# Patient Record
Sex: Female | Born: 1995 | Race: White | Hispanic: No | Marital: Single | Smoking: Never smoker
Health system: Southern US, Community
[De-identification: ages and names within clinical notes are randomized; demographics above are authoritative.]

## PROBLEM LIST (undated history)

## (undated) DIAGNOSIS — J45909 Unspecified asthma, uncomplicated: Secondary | ICD-10-CM

## (undated) HISTORY — PX: WISDOM TOOTH EXTRACTION: SHX21

## (undated) HISTORY — DX: Unspecified asthma, uncomplicated: J45.909

---
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
## 2015-09-16 IMAGING — CR DG FOOT COMPLETE 3+V*R*
3 series · 3 of 3 positions shown · non-contrast
Comparison: None.

CLINICAL DATA: Right medial tarsal pain

EXAM:
RIGHT FOOT COMPLETE - 3+ VIEW

[view not recorded (1 of 3)]
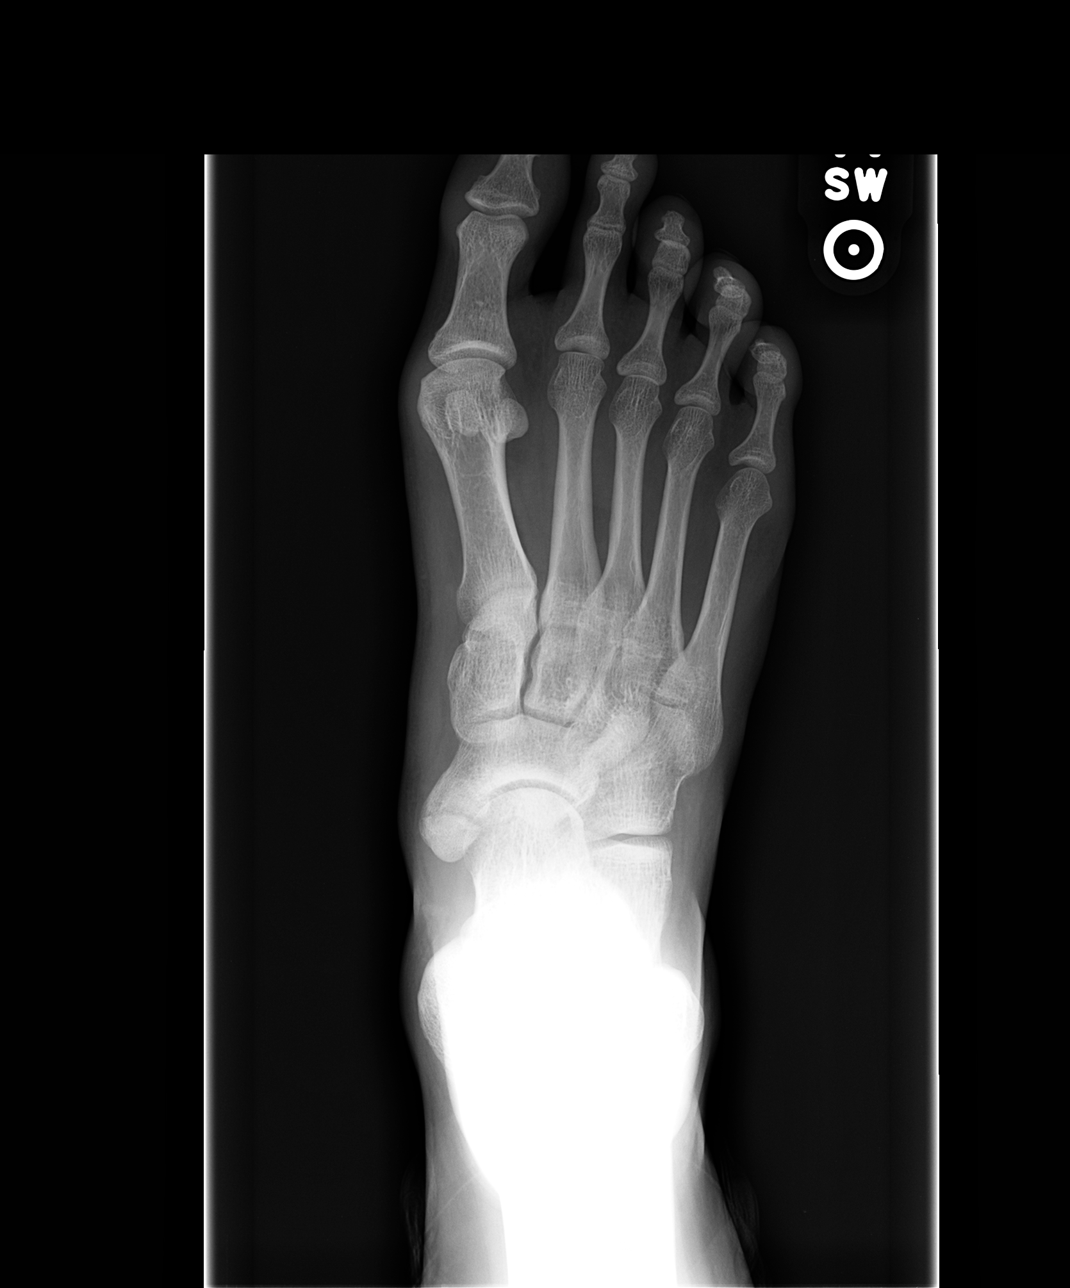

[view not recorded (2 of 3)]
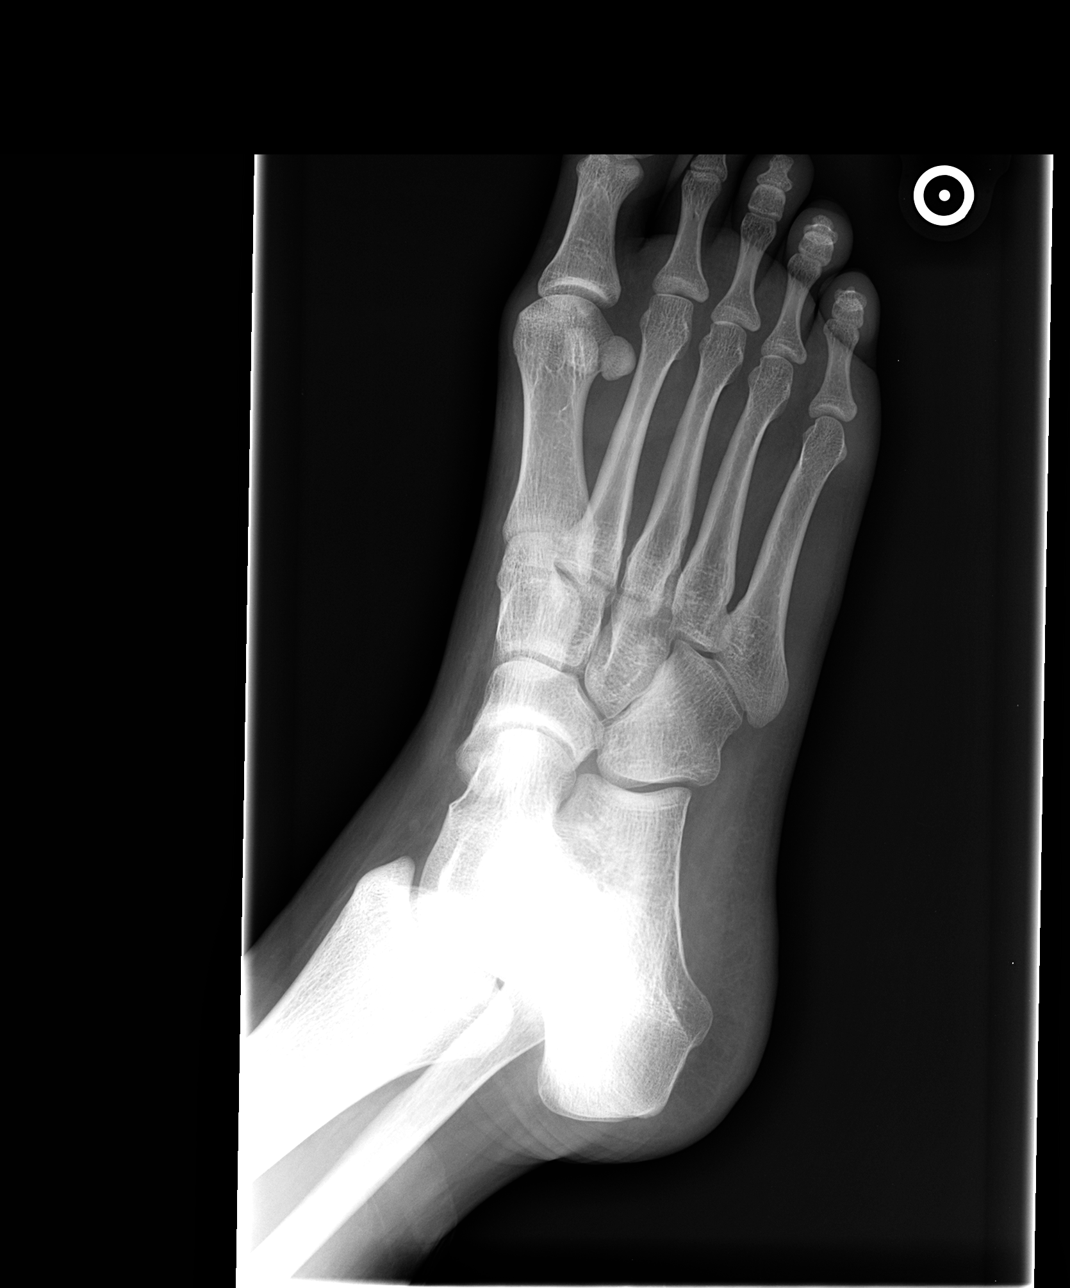

[view not recorded (3 of 3)]
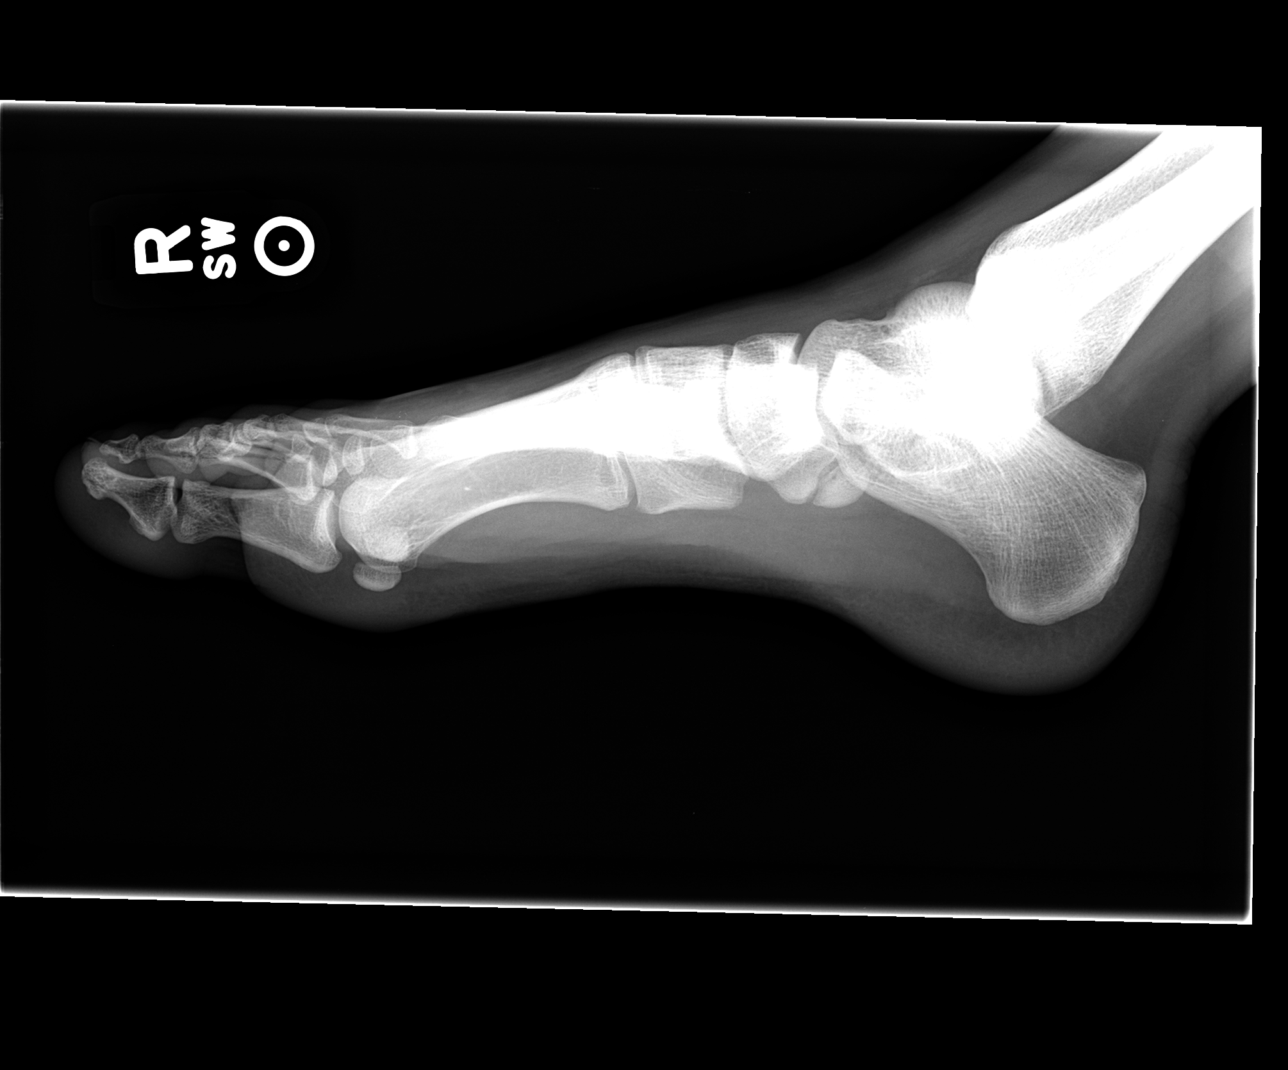

[3 of 3 positions shown; findings below may reference images not displayed]

FINDINGS: Three views of the right foot submitted. No displaced fracture or
subluxation. There is probable partial fused accessory os navicular.
As there is irregular lucent line at this level further correlation
with MRI is recommended to exclude osteochondral stress reaction.
IMPRESSION: No displaced fracture or subluxation. There is probable partial
fused accessory os navicular. As there is irregular lucent line at
this level further correlation with MRI is recommended to exclude
osteochondral stress reaction.

## 2016-05-01 ENCOUNTER — Ambulatory Visit (INDEPENDENT_AMBULATORY_CARE_PROVIDER_SITE_OTHER): Payer: BLUE CROSS/BLUE SHIELD | Admitting: Sports Medicine

## 2016-05-01 ENCOUNTER — Encounter: Payer: Self-pay | Admitting: Sports Medicine

## 2016-05-01 VITALS — BP 110/73 | HR 66 | Resp 16 | Wt 152.5 lb

## 2016-05-01 DIAGNOSIS — M84374D Stress fracture, right foot, subsequent encounter for fracture with routine healing: Secondary | ICD-10-CM

## 2016-05-01 NOTE — Assessment & Plan Note (Signed)
Minimal increase in pain at the navicular prominence of reproduction of pain with firing of the tibialis posterior. New set of custom orthotics as above, former base, and more attention paid to the arch. Return as needed. 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

## 2016-05-01 NOTE — Progress Notes (Signed)

## 2017-02-19 ENCOUNTER — Ambulatory Visit (INDEPENDENT_AMBULATORY_CARE_PROVIDER_SITE_OTHER): Payer: BLUE CROSS/BLUE SHIELD | Admitting: Physician Assistant

## 2017-02-19 ENCOUNTER — Encounter: Payer: Self-pay | Admitting: Physician Assistant

## 2017-02-19 VITALS — BP 113/69 | HR 90 | Ht 69.0 in | Wt 153.0 lb

## 2017-02-19 DIAGNOSIS — Z3009 Encounter for other general counseling and advice on contraception: Secondary | ICD-10-CM

## 2017-02-19 DIAGNOSIS — Z0000 Encounter for general adult medical examination without abnormal findings: Secondary | ICD-10-CM

## 2017-02-19 DIAGNOSIS — R5383 Other fatigue: Secondary | ICD-10-CM | POA: Diagnosis not present

## 2017-02-19 DIAGNOSIS — K130 Diseases of lips: Secondary | ICD-10-CM

## 2017-02-19 DIAGNOSIS — L659 Nonscarring hair loss, unspecified: Secondary | ICD-10-CM

## 2017-02-19 MED ORDER — LEVONORGESTREL 1.5 MG PO TABS: 1.5000 mg | ORAL_TABLET | Freq: Once | ORAL | 0 refills | Status: AC

## 2017-02-19 MED ORDER — TRIAMCINOLONE ACETONIDE 0.1 % EX CREA: 1.0000 "application " | TOPICAL_CREAM | Freq: Two times a day (BID) | CUTANEOUS | 0 refills | Status: DC

## 2017-02-19 NOTE — Progress Notes (Signed)
HPI:                                                                Katherine Salinas is a 21 y.o. female who presents to [facility]: Primary Care Sports Medicine today to establish care   Current Concerns include thyroid, hair loss, weight  Patient states over the last few months she has noticed increased shedding of her hair. She states that her hair stylist and mother also noticed. She is able to remove hair with gentle finger brushing. She self-discontinued her OCP's and changed her hair products to see if this would help, but has not noticed a change. She does endorse fatigue and increased stress at college, but denies depressed mood or insomnia. She also states her weight has fluctuated unintentionally up and down by approximately 10 pounds during this time. Denies fevers, chills, myalgias, joint pain, or rash. She reports a family history of thyroid disorders.  Health Maintenance Health Maintenance  Topic Date Due  . CHLAMYDIA SCREENING  04/30/2011  . HIV Screening  04/30/2011  . INFLUENZA VACCINE  08/18/2017 (Originally 07/18/2016)  . TETANUS/TDAP  05/22/2026    GYN/Sexual Health  Menstrual status: having periods  LMP: 02/18/17  Menses:   Last pap smear: n/a will start age 60  History of abnormal pap smears: n/a  Sexually active: yes  Current contraception: condoms  Health Habits  Diet: good, only eats chicken  Exercise: dance team  ETOH: none  Tobacco: none  Drugs: none  Past Medical History:  Diagnosis Date  . Asthma    Past Surgical History:  Procedure Laterality Date  . WISDOM TOOTH EXTRACTION     Social History  Substance Use Topics  . Smoking status: Never Smoker  . Smokeless tobacco: Never Used  . Alcohol use No   family history includes Pancreatic cancer in her maternal grandmother.  ROS: negative except as noted in the HPI  Medications: Current Outpatient Prescriptions  Medication Sig Dispense Refill  . albuterol (PROVENTIL  HFA;VENTOLIN HFA) 108 (90 Base) MCG/ACT inhaler Inhale 2 puffs 30 minutes prior to exercise and every 4-6 hours as needed.    . triamcinolone cream (KENALOG) 0.1 % Apply 1 application topically 2 (two) times daily. 30 g 0   No current facility-administered medications for this visit.    No Known Allergies     Objective:  BP 113/69   Pulse 90   Ht 5\' 9"  (1.753 m)   Wt 153 lb (69.4 kg)   BMI 22.59 kg/m  Gen: well-groomed, cooperative, not ill-appearing, no distress HEENT: normal conjunctiva, TM's clear, oropharynx clear, moist mucus membranes, no thyromegaly or tenderness Pulm: Normal work of breathing, clear to auscultation bilaterally CV: Normal rate, regular rhythm, s1 and s2 distinct, no murmurs, clicks or rubs appreciated on this exam, distal pulses intact, no peripheral edema GI: soft, nondistended, nontender, no masses Neuro: alert and oriented x 3, EOM's intact, PERRLA, DTR's intact MSK: strength 5/5 and symmetric, normal gait and station Skin: warm and dry, dry erythematous patches at the oral commissures bilaterally, few scattered erythematous facial papules Psych: normal affect, pleasant mood, normal speech and thought content  Depression screen [facility] 2/9 02/23/2017  Decreased Interest 0  Down, Depressed, Hopeless 0  PHQ - 2 Score 0  Assessment and Plan: 21 y.o. female with   1. Encounter for preventative adult health care examination - CBC - COMPLETE METABOLIC PANEL WITH GFR - Lipid Panel w/reflex Direct LDL - VITAMIN D 25 Hydroxy (Vit-D Deficiency, Fractures) - Pap due in May 2018  2. Angular cheilitis - triamcinolone cream (KENALOG) 0.1 %; Apply 1 application topically 2 (two) times daily.  Dispense: 30 g; Refill: 0  3. Hair loss - suspect telogen effluvium, but will work-up for autoimmune due to patient's concern - ANA - CK - Sedimentation rate - Rheumatoid factor - Cyclic citrul peptide antibody, IgG - C-reactive protein  4. Fatigue, unspecified  type - TSH - T4, free - ANA - CK - Sedimentation rate - Rheumatoid factor - Cyclic citrul peptide antibody, IgG - C-reactive protein  5. Encounter for counseling regarding contraception - levonorgestrel (ECONTRA EZ) 1.5 MG tablet; Take 1 tablet (1.5 mg total) by mouth once.  Dispense: 1 tablet; Refill: 0   Patient education and anticipatory guidance given Patient agrees with treatment plan Follow-up in 3 months or sooner as needed  Levonne Hubertharley E. Jabez Molner PA-C

## 2017-02-19 NOTE — Patient Instructions (Addendum)
Go downstairs for labs. We will call you this week with your results and recommended follow-up  Pap smear for cervical cancer screening beginning at age 21  Apply Triamcinolone cream to angle of the mouth twice daily. Keep lips hydrated. Avoid lip licking    Preventive Care 18-39 Years, Female Preventive care refers to lifestyle choices and visits with your health care provider that can promote health and wellness. What does preventive care include?  A yearly physical exam. This is also called an annual well check.  Dental exams once or twice a year.  Routine eye exams. Ask your health care provider how often you should have your eyes checked.  Personal lifestyle choices, including:  Daily care of your teeth and gums.  Regular physical activity.  Eating a healthy diet.  Avoiding tobacco and drug use.  Limiting alcohol use.  Practicing safe sex.  Taking vitamin and mineral supplements as recommended by your health care provider. What happens during an annual well check? The services and screenings done by your health care provider during your annual well check will depend on your age, overall health, lifestyle risk factors, and family history of disease. Counseling  Your health care provider may ask you questions about your:  Alcohol use.  Tobacco use.  Drug use.  Emotional well-being.  Home and relationship well-being.  Sexual activity.  Eating habits.  Work and work Statistician.  Method of birth control.  Menstrual cycle.  Pregnancy history. Screening  You may have the following tests or measurements:  Height, weight, and BMI.  Diabetes screening. This is done by checking your blood sugar (glucose) after you have not eaten for a while (fasting).  Blood pressure.  Lipid and cholesterol levels. These may be checked every 5 years starting at age 66.  Skin check.  Hepatitis C blood test.  Hepatitis B blood test.  Sexually transmitted disease  (STD) testing.  BRCA-related cancer screening. This may be done if you have a family history of breast, ovarian, tubal, or peritoneal cancers.  Pelvic exam and Pap test. This may be done every 3 years starting at age 1. Starting at age 18, this may be done every 5 years if you have a Pap test in combination with an HPV test. Discuss your test results, treatment options, and if necessary, the need for more tests with your health care provider. Vaccines  Your health care provider may recommend certain vaccines, such as:  Influenza vaccine. This is recommended every year.  Tetanus, diphtheria, and acellular pertussis (Tdap, Td) vaccine. You may need a Td booster every 10 years.  Varicella vaccine. You may need this if you have not been vaccinated.  HPV vaccine. If you are 36 or younger, you may need three doses over 6 months.  Measles, mumps, and rubella (MMR) vaccine. You may need at least one dose of MMR. You may also need a second dose.  Pneumococcal 13-valent conjugate (PCV13) vaccine. You may need this if you have certain conditions and were not previously vaccinated.  Pneumococcal polysaccharide (PPSV23) vaccine. You may need one or two doses if you smoke cigarettes or if you have certain conditions.  Meningococcal vaccine. One dose is recommended if you are age 38-21 years and a first-year college student living in a residence hall, or if you have one of several medical conditions. You may also need additional booster doses.  Hepatitis A vaccine. You may need this if you have certain conditions or if you travel or work in places where  you may be exposed to hepatitis A.  Hepatitis B vaccine. You may need this if you have certain conditions or if you travel or work in places where you may be exposed to hepatitis B.  Haemophilus influenzae type b (Hib) vaccine. You may need this if you have certain risk factors. Talk to your health care provider about which screenings and vaccines you  need and how often you need them. This information is not intended to replace advice given to you by your health care provider. Make sure you discuss any questions you have with your health care provider. Document Released: 01/30/2002 Document Revised: 08/23/2016 Document Reviewed: 10/05/2015 Elsevier Interactive Patient Education  2017 [employer].

## 2017-02-20 ENCOUNTER — Encounter: Payer: Self-pay | Admitting: Physician Assistant

## 2017-02-20 DIAGNOSIS — L650 Telogen effluvium: Secondary | ICD-10-CM | POA: Insufficient documentation

## 2017-02-20 LAB — ANA: ANA: NEGATIVE

## 2017-02-20 LAB — CBC
HCT: 42.1 % (ref 35.0–45.0)
Hemoglobin: 14.2 g/dL (ref 11.7–15.5)
MCH: 29.6 pg (ref 27.0–33.0)
MCHC: 33.7 g/dL (ref 32.0–36.0)
MCV: 87.9 fL (ref 80.0–100.0)
MPV: 10.1 fL (ref 7.5–12.5)
PLATELETS: 318 10*3/uL (ref 140–400)
RBC: 4.79 MIL/uL (ref 3.80–5.10)
RDW: 14.1 % (ref 11.0–15.0)
WBC: 5.4 10*3/uL (ref 3.8–10.8)

## 2017-02-20 LAB — COMPLETE METABOLIC PANEL WITH GFR
ALT: 15 U/L (ref 6–29)
AST: 22 U/L (ref 10–30)
Albumin: 4.2 g/dL (ref 3.6–5.1)
Alkaline Phosphatase: 64 U/L (ref 33–115)
BILIRUBIN TOTAL: 0.8 mg/dL (ref 0.2–1.2)
BUN: 11 mg/dL (ref 7–25)
CO2: 28 mmol/L (ref 20–31)
CREATININE: 0.71 mg/dL (ref 0.50–1.10)
Calcium: 9.1 mg/dL (ref 8.6–10.2)
Chloride: 107 mmol/L (ref 98–110)
GFR, Est African American: >89 mL/min (ref >=60)
GFR, Est Non African American: >89 mL/min (ref >=60)
Glucose, Bld: 81 mg/dL (ref 65–99)
Potassium: 4.6 mmol/L (ref 3.5–5.3)
SODIUM: 141 mmol/L (ref 135–146)
Total Protein: 6.8 g/dL (ref 6.1–8.1)

## 2017-02-20 LAB — LIPID PANEL W/REFLEX DIRECT LDL
Cholesterol: 158 mg/dL (ref <200)
HDL: 66 mg/dL (ref >50)
LDL-Cholesterol: 72 mg/dL
Non-HDL Cholesterol (Calc): 92 mg/dL (ref <130)
Total CHOL/HDL Ratio: 2.4 Ratio (ref <5.0)
Triglycerides: 116 mg/dL (ref <150)

## 2017-02-20 LAB — CYCLIC CITRUL PEPTIDE ANTIBODY, IGG: Cyclic Citrullin Peptide Ab: <16 Units

## 2017-02-20 LAB — VITAMIN D 25 HYDROXY (VIT D DEFICIENCY, FRACTURES): Vit D, 25-Hydroxy: 42 ng/mL (ref 30–100)

## 2017-02-20 LAB — T4, FREE: Free T4: 0.9 ng/dL (ref 0.8–1.4)

## 2017-02-20 LAB — CK: Total CK: 53 U/L (ref 7–177)

## 2017-02-20 LAB — C-REACTIVE PROTEIN: CRP: 1.4 mg/L (ref <8.0)

## 2017-02-20 LAB — TSH: TSH: 1.37 mIU/L

## 2017-02-20 LAB — SEDIMENTATION RATE: Sed Rate: 4 mm/hr (ref 0–20)

## 2017-02-20 LAB — RHEUMATOID FACTOR: Rhuematoid fact SerPl-aCnc: <14 IU/mL (ref <14)

## 2017-07-17 ENCOUNTER — Encounter (INDEPENDENT_AMBULATORY_CARE_PROVIDER_SITE_OTHER): Payer: Self-pay

## 2017-07-17 ENCOUNTER — Encounter: Payer: Self-pay | Admitting: Obstetrics & Gynecology

## 2017-07-17 ENCOUNTER — Ambulatory Visit (INDEPENDENT_AMBULATORY_CARE_PROVIDER_SITE_OTHER): Payer: BLUE CROSS/BLUE SHIELD | Admitting: Obstetrics & Gynecology

## 2017-07-17 VITALS — BP 84/53 | HR 68 | Resp 16 | Ht 69.0 in | Wt 149.0 lb

## 2017-07-17 DIAGNOSIS — Z3009 Encounter for other general counseling and advice on contraception: Secondary | ICD-10-CM | POA: Diagnosis not present

## 2017-07-17 MED ORDER — MISOPROSTOL 200 MCG PO TABS: ORAL_TABLET | ORAL | 0 refills | Status: DC

## 2017-07-17 NOTE — Progress Notes (Signed)
   Subjective:    Patient ID: Katherine Salinas, female    DOB: [birth year], 21 y.o.   MRN: [id number]  HPI  Pt presents to find new birth control.  Pt thinks estrogen in the the birth control caused hair loss.  Pt has never had pelvic exam.  Pt sexually active with one partner.  Uses condoms.     Review of Systems  Constitutional: Negative.   Respiratory: Negative.   Cardiovascular: Negative.   Gastrointestinal: Negative.   Genitourinary: Negative.        Objective:   Physical Exam  Constitutional: She is oriented to person, place, and time. She appears well-developed and well-nourished. No distress.  HENT:  Head: Normocephalic and atraumatic.  Eyes: Conjunctivae are normal.  Cardiovascular: Normal rate.   Pulmonary/Chest: Effort normal.  Musculoskeletal: She exhibits no edema.  Neurological: She is alert and oriented to person, place, and time.  Skin: Skin is warm and dry.  Psychiatric: She has a normal mood and affect.  Vitals reviewed.  Vitals:   07/17/17 1408  BP: (!) 84/53  Pulse: 68  Resp: 16  Weight: 149 lb (67.6 kg)  Height: 5\' 9"  (1.753 m)       Assessment & Plan:  21 yo female desiring birth control and needing first pap smear  1-Discussed all non estrogen containing methods.  Pt leaning towrads Mirena.   2-Cytotec and ibuprofen prior to procedure.  20 minutes spent face to face with patient with >50% counseling.

## 2017-10-26 ENCOUNTER — Ambulatory Visit: Payer: BLUE CROSS/BLUE SHIELD | Admitting: Family Medicine

## 2017-10-26 NOTE — Progress Notes (Deleted)
   Subjective:    Patient ID: Katherine EssexCori Salinas, female    DOB: 09-04-1996, 21 y.o.   MRN: [id number]  HPI    Review of Systems     Objective:   Physical Exam        Assessment & Plan:

## 2017-11-07 ENCOUNTER — Ambulatory Visit: Payer: BLUE CROSS/BLUE SHIELD | Admitting: Physician Assistant

## 2018-05-23 ENCOUNTER — Ambulatory Visit (INDEPENDENT_AMBULATORY_CARE_PROVIDER_SITE_OTHER): Payer: BLUE CROSS/BLUE SHIELD | Admitting: Obstetrics & Gynecology

## 2018-05-23 ENCOUNTER — Encounter: Payer: Self-pay | Admitting: Obstetrics & Gynecology

## 2018-05-23 VITALS — BP 98/57 | HR 76 | Resp 16 | Ht 69.0 in | Wt 147.0 lb

## 2018-05-23 DIAGNOSIS — Z113 Encounter for screening for infections with a predominantly sexual mode of transmission: Secondary | ICD-10-CM | POA: Diagnosis not present

## 2018-05-23 DIAGNOSIS — Z3202 Encounter for pregnancy test, result negative: Secondary | ICD-10-CM

## 2018-05-23 DIAGNOSIS — Z01419 Encounter for gynecological examination (general) (routine) without abnormal findings: Secondary | ICD-10-CM | POA: Diagnosis not present

## 2018-05-23 DIAGNOSIS — Z124 Encounter for screening for malignant neoplasm of cervix: Secondary | ICD-10-CM

## 2018-05-23 DIAGNOSIS — Z30430 Encounter for insertion of intrauterine contraceptive device: Secondary | ICD-10-CM

## 2018-05-23 LAB — POCT URINE PREGNANCY: Preg Test, Ur: NEGATIVE

## 2018-05-23 MED ORDER — LEVONORGESTREL 19.5 MCG/DAY IU IUD
INTRAUTERINE_SYSTEM | Freq: Once | INTRAUTERINE | Status: AC
  Administered 2018-05-23: 14:00:00 via INTRAUTERINE

## 2018-05-23 NOTE — Addendum Note (Signed)
Addended by: Granville LewisLARK, Emmalene Kattner L on: 05/23/2018 02:20 PM   Modules accepted: Orders

## 2018-05-23 NOTE — Progress Notes (Signed)
Subjective:    Katherine Salinas is a 22 y.o. single G0 female who presents for an annual exam. The patient has no complaints today. She wants an IUD.  She is using condoms currently. She took OCPs for about 3 years in the past, but thought the hair loss she experienced was from the pill. The patient is sexually active. GYN screening history: no prior history of gyn screening tests. The patient wears seatbelts: yes. The patient participates in regular exercise: yes. 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 The patient reports that there is not domestic violence in her life.   Menstrual History: OB History    Gravida  0   Para  0   Term  0   Preterm  0   AB  0   Living  0     SAB  0   TAB  0   Ectopic  0   Multiple  0   Live Births  0           Menarche age: [age] Patient's last menstrual period was 05/17/2018.    The following portions of the patient's history were reviewed and updated as appropriate: allergies, current medications, past family history, past medical history, past social history, past surgical history and problem list.  Review of Systems Pertinent items are noted in HPI.   FH- no breast/gyn/colon cancer + pancreatic cancer in maternal GM Monogamous for 2+ years, denies dyspareunia   Objective:    BP (!) 98/57   Pulse 76   Resp 16   Ht 5\' 9"  (1.753 m)   Wt 147 lb (66.7 kg)   LMP 05/17/2018   BMI 21.71 kg/m   General Appearance:    Alert, cooperative, no distress, appears stated age  Head:    Normocephalic, without obvious abnormality, atraumatic  Eyes:    PERRL, conjunctiva/corneas clear, EOM's intact, fundi    benign, both eyes  Ears:    Normal TM's and external ear canals, both ears  Nose:   Nares normal, septum midline, mucosa normal, no drainage    or sinus tenderness  Throat:   Lips, mucosa, and tongue normal; teeth and gums normal  Neck:   Supple, symmetrical, trachea midline,  no adenopathy;    thyroid:  no enlargement/tenderness/nodules; no carotid   bruit or JVD  Back:     Symmetric, no curvature, ROM normal, no CVA tenderness  Lungs:     Clear to auscultation bilaterally, respirations unlabored  Chest Wall:    No tenderness or deformity   Heart:    Regular rate and rhythm, S1 and S2 normal, no murmur, rub   or gallop  Breast Exam:    No tenderness, masses, or nipple abnormality  Abdomen:     Soft, non-tender, bowel sounds active all four quadrants,    no masses, no organomegaly  Genitalia:    Normal female without lesion, discharge or tenderness, normal size and shape, anteverted, mobile, non-tender, normal adnexal exam UPT negative, consent signed, Time out procedure done. Cervix prepped with betadine and grasped with a single tooth tenaculum. Her cervix was stenotic so I had to dilate it first. Katherine Salinas was easily placed and the strings were cut to 3-4 cm. Uterus sounded to 9 cm. She tolerated the procedure well.       Extremities:   Extremities normal, atraumatic, no cyanosis or edema  Pulses:   2+ and symmetric all extremities  Skin:   Skin  color, texture, turgor normal, no rashes or lesions  Lymph nodes:   Cervical, supraclavicular, and axillary nodes normal  Neurologic:   CNII-XII intact, normal strength, sensation and reflexes    throughout  .    Assessment:    Healthy female exam.    Plan:     Chlamydia specimen. GC specimen. Thin prep Pap smear.

## 2018-05-27 LAB — CYTOLOGY - PAP
Chlamydia: NEGATIVE
Diagnosis: NEGATIVE
NEISSERIA GONORRHEA: NEGATIVE

## 2018-07-16 ENCOUNTER — Encounter: Payer: Self-pay | Admitting: Physician Assistant

## 2018-07-16 ENCOUNTER — Ambulatory Visit (INDEPENDENT_AMBULATORY_CARE_PROVIDER_SITE_OTHER): Payer: BLUE CROSS/BLUE SHIELD | Admitting: Physician Assistant

## 2018-07-16 VITALS — BP 104/68 | HR 60 | Wt 149.0 lb

## 2018-07-16 DIAGNOSIS — J45990 Exercise induced bronchospasm: Secondary | ICD-10-CM | POA: Diagnosis not present

## 2018-07-16 MED ORDER — ALBUTEROL SULFATE HFA 108 (90 BASE) MCG/ACT IN AERS: INHALATION_SPRAY | RESPIRATORY_TRACT | 2 refills | Status: AC

## 2018-07-16 NOTE — Progress Notes (Deleted)
104/68

## 2018-07-16 NOTE — Patient Instructions (Signed)
Exercise-Induced Bronchospasm  Exercise-induced bronchospasm (EIB) happens when the airways narrow during or after exercise. The airways are the passages that lead from the nose and mouth down into the lungs. When the airways narrow, this can cause coughing, wheezing, and shortness of breath. Anyone can develop this condition, even those who do not have asthma or allergies.  To help prevent episodes of EIB, you may need to take medicine or change your workout routine. You should tell your coach, teammates, or workout partners about your condition so they know how to help you if you do have an episode.  What are the causes?  The exact cause of this condition is not known. Symptoms are brought on (triggered) by physical activity. EIB can also be triggered by dry air or by allergens and irritants, such as the chemicals used in pools and skating rinks.  What increases the risk?  The following factors may make you more likely to develop this condition:   Having asthma.   Exercising in dry air.   Exercising outdoors during allergy season.   Playing an outdoor sport that requires continuous motion. This includes sports such as soccer, hockey, and cross-country skiing.    What are the signs or symptoms?  Symptoms of this condition include:   Wheezing.   Coughing.   Shortness of breath.   Tightness in the chest.   Sore throat.   Upset stomach.    How is this diagnosed?  This condition may be diagnosed based on your symptoms, your medical history, and a physical exam. A test may be done to measure how exercise affects your breathing (spirometry test). For this test, you breathe into a device before and after exercising.  How is this treated?  Treatment for this condition may include:   Changing your exercise routine. You may have to:  ? Spend a few minutes warming up before your workout.  ? Exercise indoors when the air is dry or during allergy season.   Taking medicine. Your health care provider may  prescribe:  ? An inhaler for you to use before you exercise.  ? Oral medicine to control allergies and asthma.  ? Inhaled steroids.    Follow these instructions at home:   Take over-the-counter and prescription medicines only as told by your health care provider.   Keep all bronchospasm medicine with you during your workout.   Make changes in your workout as told by your health care provider.   Wear a medical ID bracelet. Tell your coach, trainer, or teammates about your condition.   If you are planning to exercise alone or in an isolated area, let someone know where you are going and when you will be back.   Keep all follow-up visits as told by your health care provider. This is important.  How is this prevented?   Take medicines to prevent exercise-induced bronchospasm as told by your health care provider. Work with your coach or trainer to make changes to your workout as needed.   If dry air triggers exercise-induced bronchospasm:  ? Exercise indoors during peak allergy season and on days that are dry or cold.  ? Try to breathe in warm, moist air by wearing a scarf over your nose and mouth or breathing only through your nose.  Contact a health care provider if:   You have coughing, wheezing, or shortness of breath that continues after treatment.   Your coughing wakes you up at night.   You have less endurance than you   used to.  Get help right away if:   You cannot catch your breath.   You pass out.  This information is not intended to replace advice given to you by your health care provider. Make sure you discuss any questions you have with your health care provider.  Document Released: 12/04/2005 Document Revised: 12/23/2015 Document Reviewed: 08/11/2015  Elsevier Interactive Patient Education  2017 [employer] Inc[employer]

## 2018-07-16 NOTE — Progress Notes (Signed)
HPI:                                                                Katherine Salinas is a 22 y.o. 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 Reports history of "breathing problems" as a child where she would receive intermittent breathing treatments. Reports intermittent wheezing and chest tightness with exertion. 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 Symptoms are well managed on 2 puffs 15 minute prior to exercise. Denies triggers with colds/URI's or pollen/allergens. Denies exacerbations in the last year. Denies nocturnal cough.   Depression screen PHQ 2/9 02/23/2017  Decreased Interest 0  Down, Depressed, Hopeless 0  PHQ - 2 Score 0    No flowsheet data found.    Past Medical History:  Diagnosis Date  . Asthma    Past Surgical History:  Procedure Laterality Date  . WISDOM TOOTH EXTRACTION     Social History   Tobacco Use  . Smoking status: Never Smoker  . Smokeless tobacco: Never Used  Substance Use Topics  . Alcohol use: No   family history includes Pancreatic cancer in her maternal grandmother.    ROS: negative except as noted in the HPI  Medications: Current Outpatient Medications  Medication Sig Dispense Refill  . albuterol (PROVENTIL HFA;VENTOLIN HFA) 108 (90 Base) MCG/ACT inhaler Inhale 2 puffs 30 minutes prior to exercise and every 4-6 hours as needed.    . triamcinolone cream (KENALOG) 0.1 % Apply 1 application topically 2 (two) times daily. 30 g 0   No current facility-administered medications for this visit.    No Known Allergies     Objective:  BP 104/68   Pulse 60   Wt 149 lb (67.6 kg)   SpO2 98%   BMI 22.00 kg/m  Gen:  alert, not ill-appearing, no distress, appropriate for age HEENT: head normocephalic without obvious abnormality, conjunctiva and  cornea clear, trachea midline Pulm: Normal work of breathing, normal phonation, clear to auscultation bilaterally, no wheezes, rales or rhonchi CV: Normal rate, regular rhythm, s1 and s2 distinct, no murmurs, clicks or rubs  Neuro: alert and oriented x 3, no tremor MSK: extremities atraumatic, normal gait and station Skin: intact, no rashes on exposed skin, no jaundice, no cyanosis Psych: well-groomed, cooperative, good eye contact, euthymic mood, affect mood-congruent, speech is articulate, and thought processes clear and goal-directed    No results found for this or any previous visit (from the past 72 hour(s)). No results found.    Assessment and Plan: 22 y.o. female with   Exercise induced bronchospasm - Plan: albuterol (PROVENTIL HFA;VENTOLIN HFA) 108 (90 Base) MCG/ACT inhaler  SpO2 98% on RA at rest, no exacerbations in the last year, no DOE or cough to necessitate controller medication Continue Ventolin 2 puffs 15 minutes prior to exercise and 1-2 puffs prn   Patient education and anticipatory guidance given Patient agrees with treatment plan Follow-up in 1 year or sooner as needed if symptoms worsen or fail to improve  Levonne Hubertharley E. Zakk Borgen PA-C

## 2021-03-11 ENCOUNTER — Encounter: Payer: BLUE CROSS/BLUE SHIELD | Admitting: Medical-Surgical

## 2021-03-16 ENCOUNTER — Encounter: Payer: Self-pay | Admitting: Medical-Surgical

## 2021-03-16 ENCOUNTER — Other Ambulatory Visit: Payer: Self-pay

## 2021-03-16 ENCOUNTER — Ambulatory Visit (INDEPENDENT_AMBULATORY_CARE_PROVIDER_SITE_OTHER): Payer: BC Managed Care – PPO | Admitting: Medical-Surgical

## 2021-03-16 VITALS — BP 94/59 | HR 72 | Temp 98.4°F | Ht 69.5 in | Wt 165.9 lb

## 2021-03-16 DIAGNOSIS — Z0000 Encounter for general adult medical examination without abnormal findings: Secondary | ICD-10-CM

## 2021-03-16 DIAGNOSIS — Z114 Encounter for screening for human immunodeficiency virus [HIV]: Secondary | ICD-10-CM | POA: Diagnosis not present

## 2021-03-16 DIAGNOSIS — Z1329 Encounter for screening for other suspected endocrine disorder: Secondary | ICD-10-CM | POA: Diagnosis not present

## 2021-03-16 DIAGNOSIS — Z1159 Encounter for screening for other viral diseases: Secondary | ICD-10-CM | POA: Diagnosis not present

## 2021-03-16 NOTE — Patient Instructions (Signed)
Preventive Care 21-25 Years Old, Female Preventive care refers to lifestyle choices and visits with your health care provider that can promote health and wellness. This includes:  A yearly physical exam. This is also called an annual wellness visit.  Regular dental and eye exams.  Immunizations.  Screening for certain conditions.  Healthy lifestyle choices, such as: ? Eating a healthy diet. ? Getting regular exercise. ? Not using drugs or products that contain nicotine and tobacco. ? Limiting alcohol use. What can I expect for my preventive care visit? Physical exam Your health care provider may check your:  Height and weight. These may be used to calculate your BMI (body mass index). BMI is a measurement that tells if you are at a healthy weight.  Heart rate and blood pressure.  Body temperature.  Skin for abnormal spots. Counseling Your health care provider may ask you questions about your:  Past medical problems.  Family's medical history.  Alcohol, tobacco, and drug use.  Emotional well-being.  Home life and relationship well-being.  Sexual activity.  Diet, exercise, and sleep habits.  Work and work environment.  Access to firearms.  Method of birth control.  Menstrual cycle.  Pregnancy history. What immunizations do I need? Vaccines are usually given at various ages, according to a schedule. Your health care provider will recommend vaccines for you based on your age, medical history, and lifestyle or other factors, such as travel or where you work.   What tests do I need? Blood tests  Lipid and cholesterol levels. These may be checked every 5 years starting at age 20.  Hepatitis C test.  Hepatitis B test. Screening  Diabetes screening. This is done by checking your blood sugar (glucose) after you have not eaten for a while (fasting).  STD (sexually transmitted disease) testing, if you are at risk.  BRCA-related cancer screening. This may be  done if you have a family history of breast, ovarian, tubal, or peritoneal cancers.  Pelvic exam and Pap test. This may be done every 3 years starting at age 21. Starting at age 30, this may be done every 5 years if you have a Pap test in combination with an HPV test. Talk with your health care provider about your test results, treatment options, and if necessary, the need for more tests.   Follow these instructions at home: Eating and drinking  Eat a healthy diet that includes fresh fruits and vegetables, whole grains, lean protein, and low-fat dairy products.  Take vitamin and mineral supplements as recommended by your health care provider.  Do not drink alcohol if: ? Your health care provider tells you not to drink. ? You are pregnant, may be pregnant, or are planning to become pregnant.  If you drink alcohol: ? Limit how much you have to 0-1 drink a day. ? Be aware of how much alcohol is in your drink. In the [place] one drink equals one 12 oz bottle of beer (355 mL), one 5 oz glass of wine (148 mL), or one 1 oz glass of hard liquor (44 mL).   Lifestyle  Take daily care of your teeth and gums. Brush your teeth every morning and night with fluoride toothpaste. Floss one time each day.  Stay active. Exercise for at least 30 minutes 5 or more days each week.  Do not use any products that contain nicotine or tobacco, such as cigarettes, e-cigarettes, and chewing tobacco. If you need help quitting, ask your health care provider.  Do not   use drugs.  If you are sexually active, practice safe sex. Use a condom or other form of protection to prevent STIs (sexually transmitted infections).  If you do not wish to become pregnant, use a form of birth control. If you plan to become pregnant, see your health care provider for a prepregnancy visit.  Find healthy ways to cope with stress, such as: ? Meditation, yoga, or listening to music. ? Journaling. ? Talking to a trusted  person. ? Spending time with friends and family. Safety  Always wear your seat belt while driving or riding in a vehicle.  Do not drive: ? If you have been drinking alcohol. Do not ride with someone who has been drinking. ? When you are tired or distracted. ? While texting.  Wear a helmet and other protective equipment during sports activities.  If you have firearms in your house, make sure you follow all gun safety procedures.  Seek help if you have been physically or sexually abused. What's next?  Go to your health care provider once a year for an annual wellness visit.  Ask your health care provider how often you should have your eyes and teeth checked.  Stay up to date on all vaccines. This information is not intended to replace advice given to you by your health care provider. Make sure you discuss any questions you have with your health care provider. Document Revised: 08/01/2020 Document Reviewed: 08/15/2018 Elsevier Patient Education  2021 [employer]  

## 2021-03-16 NOTE — Progress Notes (Signed)
HPI: Katherine Salinas is a 25 y.o. female who  has a past medical history of Asthma.  she presents to [facility] today, 03/16/21,  for chief complaint of: Annual physical exam  Dentist: last week, no concerns Eye exam: upcoming appointment, no correction Exercise: cardio and weight lifting 5-6 days/week Diet: no red meat or pork; eats healthy Pap smear: 1.5-2 years ago, normal COVID vaccine: done, no booster yet  Concerns: None   Past medical, surgical, social and family history reviewed:  Patient Active Problem List   Diagnosis Date Noted  . 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 Angular cheilitis 02/19/2017  . Hair loss 02/19/2017  . Fatigue 02/19/2017    Past Surgical History:  Procedure Laterality Date  . WISDOM TOOTH EXTRACTION      Social History   Tobacco Use  . Smoking status: Never Smoker  . Smokeless tobacco: Never Used  Substance Use Topics  . Alcohol use: Yes    Comment: occasionally    Family History  Problem Relation Age of Onset  . Pancreatic cancer Maternal Grandmother      Current medication list and allergy/intolerance information reviewed:    Current Outpatient Medications  Medication Sig Dispense Refill  . albuterol (PROVENTIL HFA;VENTOLIN HFA) 108 (90 Base) MCG/ACT inhaler Inhale 2 puffs 30 minutes prior to exercise and every 4-6 hours as needed. 2 Inhaler 2  . triamcinolone cream (KENALOG) 0.1 % Apply 1 application topically 2 (two) times daily. 30 g 0   No current facility-administered medications for this visit.    No Known Allergies    Review of Systems:  Constitutional:  No  fever, no chills, No recent illness, No unintentional weight changes. No significant fatigue.   HEENT: No  headache, no vision change, no hearing change, No sore throat, No  sinus pressure  Cardiac: No  chest pain, No  pressure, No palpitations, No  Orthopnea  Respiratory:  No  shortness of breath. No  Cough  Gastrointestinal: No   abdominal pain, No  nausea, No  vomiting,  No  blood in stool, No  diarrhea, No  constipation   Musculoskeletal: No new myalgia/arthralgia  Skin: No  Rash, No other wounds/concerning lesions  Genitourinary: No  incontinence, No  abnormal genital bleeding, No abnormal genital discharge  Hem/Onc: No  easy bruising/bleeding, No  abnormal lymph node  Endocrine: No cold intolerance,  No heat intolerance. No polyuria/polydipsia/polyphagia   Neurologic: No  weakness, No  dizziness, No  slurred speech/focal weakness/facial droop  Psychiatric: No  concerns with depression, No  concerns with anxiety, No sleep problems, No mood problems  Exam:  BP (!) 94/59   Pulse 72   Temp 98.4 F (36.9 C)   Ht 5' 9.5" (1.765 m)   Wt 165 lb 14.4 oz (75.3 kg)   LMP 02/25/2021   SpO2 99%   BMI 24.15 kg/m   Constitutional: VS see above. General Appearance: alert, well-developed, well-nourished, NAD  Eyes: Normal lids and conjunctive, non-icteric sclera  Ears, Nose, Mouth, Throat: MMM, Normal external inspection ears/nares/mouth/lips/gums. TM normal bilaterally.   Neck: No masses, trachea midline. No thyroid enlargement. No tenderness/mass appreciated. No lymphadenopathy  Respiratory: Normal respiratory effort. no wheeze, no rhonchi, no rales  Cardiovascular: S1/S2 normal, no murmur, no rub/gallop auscultated. RRR. No lower extremity edema. Pedal pulse II/IV bilaterally PT. No carotid bruit or JVD. No abdominal aortic bruit.  Gastrointestinal: Nontender, no masses. No hepatomegaly, no splenomegaly. No hernia appreciated. Bowel sounds normal. Rectal exam deferred.  Musculoskeletal: Gait normal. No clubbing/cyanosis of digits.   Neurological: Normal balance/coordination. No tremor. No cranial nerve deficit on limited exam. Motor and sensation intact and symmetric. Cerebellar reflexes intact.   Skin: warm, dry, intact. No rash/ulcer. No concerning nevi or subq nodules on limited exam.     Psychiatric: Normal judgment/insight. Normal mood and affect. Oriented x3.   No results found for this or any previous visit (from the past 72 hour(s)).  No results found.  ASSESSMENT/PLAN:   1. Annual physical exam Checking CBC, CMP, and lipid panel today.  Up-to-date on preventative care.  2. Screening for HIV (human immunodeficiency virus) 3. Need for hepatitis C screening test Discussed screening recommendations.  Patient agreeable so adding to blood work today.  4. Screening for endocrine disorder Checking TSH.  Orders Placed This Encounter  Procedures  . CBC with Differential/Platelet  . COMPLETE METABOLIC PANEL WITH GFR  . Lipid panel  . Hepatitis C antibody  . HIV Antibody (routine testing w rflx)  . TSH    No orders of the defined types were placed in this encounter.   Patient Instructions   Preventive Care 57-5 Years Old, Female Preventive care refers to lifestyle choices and visits with your health care provider that can promote health and wellness. This includes:  A yearly physical exam. This is also called an annual wellness visit.  Regular dental and eye exams.  Immunizations.  Screening for certain conditions.  Healthy lifestyle choices, such as: ? Eating a healthy diet. ? Getting regular exercise. ? Not using drugs or products that contain nicotine and tobacco. ? Limiting alcohol use. What can I expect for my preventive care visit? Physical exam Your health care provider may check your:  Height and weight. These may be used to calculate your BMI (body mass index). BMI is a measurement that tells if you are at a healthy weight.  Heart rate and blood pressure.  Body temperature.  Skin for abnormal spots. Counseling Your health care provider may ask you questions about your:  Past medical problems.  Family's medical history.  Alcohol, tobacco, and drug use.  Emotional well-being.  Home life and relationship well-being.  Sexual  activity.  Diet, exercise, and sleep habits.  Work and work Statistician.  Access to firearms.  Method of birth control.  Menstrual cycle.  Pregnancy history. What immunizations do I need? Vaccines are usually given at various ages, according to a schedule. Your health care provider will recommend vaccines for you based on your age, medical history, and lifestyle or other factors, such as travel or where you work.   What tests do I need? Blood tests  Lipid and cholesterol levels. These may be checked every 5 years starting at age 55.  Hepatitis C test.  Hepatitis B test. Screening  Diabetes screening. This is done by checking your blood sugar (glucose) after you have not eaten for a while (fasting).  STD (sexually transmitted disease) testing, if you are at risk.  BRCA-related cancer screening. This may be done if you have a family history of breast, ovarian, tubal, or peritoneal cancers.  Pelvic exam and Pap test. This may be done every 3 years starting at age 18. Starting at age 37, this may be done every 5 years if you have a Pap test in combination with an HPV test. Talk with your health care provider about your test results, treatment options, and if necessary, the need for more tests.   Follow these instructions at home: Eating and  drinking  Eat a healthy diet that includes fresh fruits and vegetables, whole grains, lean protein, and low-fat dairy products.  Take vitamin and mineral supplements as recommended by your health care provider.  Do not drink alcohol if: ? Your health care provider tells you not to drink. ? You are pregnant, may be pregnant, or are planning to become pregnant.  If you drink alcohol: ? Limit how much you have to 0-1 drink a day. ? Be aware of how much alcohol is in your drink. In the [place] one drink equals one 12 oz bottle of beer (355 mL), one 5 oz glass of wine (148 mL), or one 1 oz glass of hard liquor (44 mL).   Lifestyle  Take  daily care of your teeth and gums. Brush your teeth every morning and night with fluoride toothpaste. Floss one time each day.  Stay active. Exercise for at least 30 minutes 5 or more days each week.  Do not use any products that contain nicotine or tobacco, such as cigarettes, e-cigarettes, and chewing tobacco. If you need help quitting, ask your health care provider.  Do not use drugs.  If you are sexually active, practice safe sex. Use a condom or other form of protection to prevent STIs (sexually transmitted infections).  If you do not wish to become pregnant, use a form of birth control. If you plan to become pregnant, see your health care provider for a prepregnancy visit.  Find healthy ways to cope with stress, such as: ? Meditation, yoga, or listening to music. ? Journaling. ? Talking to a trusted person. ? Spending time with friends and family. Safety  Always wear your seat belt while driving or riding in a vehicle.  Do not drive: ? If you have been drinking alcohol. Do not ride with someone who has been drinking. ? When you are tired or distracted. ? While texting.  Wear a helmet and other protective equipment during sports activities.  If you have firearms in your house, make sure you follow all gun safety procedures.  Seek help if you have been physically or sexually abused. What's next?  Go to your health care provider once a year for an annual wellness visit.  Ask your health care provider how often you should have your eyes and teeth checked.  Stay up to date on all vaccines. This information is not intended to replace advice given to you by your health care provider. Make sure you discuss any questions you have with your health care provider. Document Revised: 08/01/2020 Document Reviewed: 08/15/2018 Elsevier Patient Education  2021 [place].  Follow-up plan: Return in about 1 year (around 03/16/2022) for annual physical exam.  Clearnce Sorrel, DNP, APRN,  FNP-BC [facility]

## 2021-03-18 LAB — CBC WITH DIFFERENTIAL/PLATELET
RDW: 12.5 % (ref 11.0–15.0)
Total Lymphocyte: 33.7 %

## 2021-03-19 LAB — CBC WITH DIFFERENTIAL/PLATELET
HCT: 43 % (ref 35.0–45.0)
MCH: 29.7 pg (ref 27.0–33.0)

## 2021-03-19 LAB — LIPID PANEL
HDL: 61 mg/dL (ref >=50)
LDL Cholesterol (Calc): 66 mg/dL (calc)
Total CHOL/HDL Ratio: 2.3 (calc) (ref <5.0)

## 2021-03-19 LAB — COMPLETE METABOLIC PANEL WITH GFR
AG Ratio: 2 (calc) (ref 1.0–2.5)
Alkaline phosphatase (APISO): 57 U/L (ref 31–125)
Sodium: 137 mmol/L (ref 135–146)
Total Protein: 7.4 g/dL (ref 6.1–8.1)

## 2021-03-19 LAB — TSH: TSH: 3 mIU/L

## 2021-03-21 LAB — LIPID PANEL
Cholesterol: 142 mg/dL (ref <200)
Non-HDL Cholesterol (Calc): 81 mg/dL (calc) (ref <130)
Triglycerides: 72 mg/dL (ref <150)

## 2021-03-21 LAB — CBC WITH DIFFERENTIAL/PLATELET
Absolute Monocytes: 775 cells/uL (ref 200–950)
Basophils Absolute: 31 cells/uL (ref 0–200)
Basophils Relative: 0.5 %
Eosinophils Absolute: 62 cells/uL (ref 15–500)
Eosinophils Relative: 1 %
Hemoglobin: 14.6 g/dL (ref 11.7–15.5)
Lymphs Abs: 2089 cells/uL (ref 850–3900)
MCHC: 34 g/dL (ref 32.0–36.0)
MCV: 87.6 fL (ref 80.0–100.0)
MPV: 10.3 fL (ref 7.5–12.5)
Monocytes Relative: 12.5 %
Neutro Abs: 3243 cells/uL (ref 1500–7800)
Neutrophils Relative %: 52.3 %
Platelets: 300 10*3/uL (ref 140–400)
RBC: 4.91 10*6/uL (ref 3.80–5.10)
WBC: 6.2 10*3/uL (ref 3.8–10.8)

## 2021-03-21 LAB — HIV ANTIBODY (ROUTINE TESTING W REFLEX): HIV 1&2 Ab, 4th Generation: NONREACTIVE

## 2021-03-21 LAB — COMPLETE METABOLIC PANEL WITH GFR
ALT: 17 U/L (ref 6–29)
AST: 26 U/L (ref 10–30)
Albumin: 4.9 g/dL (ref 3.6–5.1)
BUN: 12 mg/dL (ref 7–25)
CO2: 26 mmol/L (ref 20–32)
Calcium: 9.5 mg/dL (ref 8.6–10.2)
Chloride: 104 mmol/L (ref 98–110)
Creat: 0.77 mg/dL (ref 0.50–1.10)
GFR, Est African American: 125 mL/min/{1.73_m2} (ref >=60)
GFR, Est Non African American: 108 mL/min/{1.73_m2} (ref >=60)
Globulin: 2.5 g/dL (calc) (ref 1.9–3.7)
Glucose, Bld: 85 mg/dL (ref 65–99)
Potassium: 4.8 mmol/L (ref 3.5–5.3)
Total Bilirubin: 0.7 mg/dL (ref 0.2–1.2)

## 2021-03-21 LAB — HEPATITIS C ANTIBODY
Hepatitis C Ab: NONREACTIVE
SIGNAL TO CUT-OFF: 0.04 (ref <1.00)

## 2021-06-22 ENCOUNTER — Encounter: Payer: Self-pay | Admitting: Medical-Surgical

## 2021-06-22 ENCOUNTER — Ambulatory Visit (INDEPENDENT_AMBULATORY_CARE_PROVIDER_SITE_OTHER): Payer: BC Managed Care – PPO | Admitting: Medical-Surgical

## 2021-06-22 ENCOUNTER — Other Ambulatory Visit: Payer: Self-pay

## 2021-06-22 VITALS — BP 97/67 | HR 67 | Temp 98.4°F | Ht 69.5 in | Wt 163.0 lb

## 2021-06-22 DIAGNOSIS — L309 Dermatitis, unspecified: Secondary | ICD-10-CM | POA: Diagnosis not present

## 2021-06-22 MED ORDER — TRIAMCINOLONE ACETONIDE 0.1 % EX CREA: 1.0000 "application " | TOPICAL_CREAM | Freq: Two times a day (BID) | CUTANEOUS | 0 refills | Status: AC | PRN

## 2021-06-22 MED ORDER — LEVOCETIRIZINE DIHYDROCHLORIDE 5 MG PO TABS: ORAL_TABLET | ORAL | 0 refills | Status: AC

## 2021-06-22 MED ORDER — FAMOTIDINE 20 MG PO TABS: 20.0000 mg | ORAL_TABLET | Freq: Two times a day (BID) | ORAL | 1 refills | Status: AC

## 2021-06-22 MED ORDER — PREDNISONE 10 MG (21) PO TBPK: ORAL_TABLET | ORAL | 0 refills | Status: AC

## 2021-06-22 MED ORDER — HYDROXYZINE PAMOATE 25 MG PO CAPS: 25.0000 mg | ORAL_CAPSULE | Freq: Three times a day (TID) | ORAL | 0 refills | Status: AC | PRN

## 2021-06-22 NOTE — Progress Notes (Signed)
Subjective:    CC: Possible hives  HPI: Pleasant 25 year old female presenting today for evaluation of hives that have been occurring daily.  She has been experiencing this over the last couple of months.  She has had no changes in cosmetics, medications, foods, detergents, or household chemical use.  She has not worn any close before washing them.  Wonders if her skin symptoms may be related to food or possibly to her menses.  Notes that they started around the beginning of her period and did not start resolving until near the end of her period.  Notes that the areas in question are red and they developed a white bump in the center.  Spots are very itchy and only affect the front side of her body.  She has not had any develop on her back or buttocks.  Notes that they have started to improve and are much better than they were originally but she is still having difficulty with the itching.  She uses lotions and detergents that are formulated for sensitive skin already.  She is currently taking loratadine and has been taking this for many years has not switched to any other antihistamine because she feels loratadine is the only one that works.  Is not taking famotidine.  She has used over-the-counter hydrocortisone cream but this was not helpful for her symptoms.  Has had history of hives in her early childhood of unknown etiology.  Has not been evaluated by an allergist before.  I reviewed the past medical history, family history, social history, surgical history, and allergies today and no changes were needed.  Please see the problem list section below in epic for further details.  Past Medical History: Past Medical History:  Diagnosis Date   Asthma    Past Surgical History: Past Surgical History:  Procedure Laterality Date   WISDOM TOOTH EXTRACTION     Social History: Social History   Socioeconomic History   Marital status: Single    Spouse name: Not on file   Number of children: Not on  file   Years of education: Not on file   Highest education level: Not on file  Occupational History   Not on file  Tobacco Use   Smoking status: Never   Smokeless tobacco: Never  Substance and Sexual Activity   Alcohol use: Yes    Comment: occasionally   Drug use: No   Sexual activity: Yes    Birth control/protection: Condom  Other Topics Concern   Not on file  Social History Narrative   Not on file   Social Determinants of Health   Financial Resource Strain: Not on file  Food Insecurity: Not on file  Transportation Needs: Not on file  Physical Activity: Not on file  Stress: Not on file  Social Connections: Not on file   Family History: Family History  Problem Relation Age of Onset   Pancreatic cancer Maternal Grandmother    Allergies: No Known Allergies Medications: See med rec.  Review of Systems: See HPI for pertinent positives and negatives.   Objective:    General: Well Developed, well nourished, and in no acute distress.  Neuro: Alert and oriented x3.  HEENT: Normocephalic, atraumatic.  Skin: Warm and dry.  Scattered erythematous macular lesions over extremities with small intact papules present in some. Cardiac: Regular rate and rhythm, no murmurs rubs or gallops, no lower extremity edema.  Respiratory: Clear to auscultation bilaterally. Not using accessory muscles, speaking in full sentences.  Impression and Recommendations:  1. Dermatitis Unclear etiology.  Presentation atypical for urticaria.  Recommend switching to hypoallergenic agents for laundry and cosmetics.  Start prednisone for 6-day taper.  Triamcinolone cream topically twice daily as needed.  Switch from loratadine to Xyzal 5 mg twice daily for 2 weeks then reduce to once daily.  Start famotidine 20 mg twice daily.  Hydroxyzine 25 mg 3 times daily as needed for itching.  Advised patient this will likely make her drowsy so avoid use when she has to work or operate a vehicle.  Referring to  allergy for further evaluation. - Ambulatory referral to Allergy  Return if symptoms worsen or fail to improve. ___________________________________________ Thayer Ohm, DNP, APRN, FNP-BC [facility] [place]
